# Patient Record
Sex: Female | Born: 1990 | Race: Black or African American | Hispanic: No | Marital: Single | Smoking: Never smoker
Health system: Southern US, Community
[De-identification: ages and names within clinical notes are randomized; demographics above are authoritative.]

## PROBLEM LIST (undated history)

## (undated) ENCOUNTER — Inpatient Hospital Stay (HOSPITAL_COMMUNITY): Payer: Self-pay

---

## 2010-03-12 ENCOUNTER — Emergency Department (HOSPITAL_COMMUNITY): Admission: EM | Admit: 2010-03-12 | Discharge: 2010-03-12 | Payer: Self-pay | Admitting: Family Medicine

## 2010-11-25 ENCOUNTER — Emergency Department (HOSPITAL_COMMUNITY)
Admission: EM | Admit: 2010-11-25 | Discharge: 2010-11-25 | Payer: Self-pay | Source: Home / Self Care | Admitting: Emergency Medicine

## 2011-02-26 LAB — POCT PREGNANCY, URINE: Preg Test, Ur: NEGATIVE

## 2011-03-11 LAB — POCT PREGNANCY, URINE: Preg Test, Ur: NEGATIVE

## 2011-03-11 LAB — POCT URINALYSIS DIP (DEVICE)
Glucose, UA: NEGATIVE mg/dL
Protein, ur: NEGATIVE mg/dL

## 2011-09-28 ENCOUNTER — Inpatient Hospital Stay (INDEPENDENT_AMBULATORY_CARE_PROVIDER_SITE_OTHER)
Admission: RE | Admit: 2011-09-28 | Discharge: 2011-09-28 | Disposition: A | Discharge status: Home / Self Care | Payer: Medicaid Other | Source: Ambulatory Visit | Attending: Family Medicine | Admitting: Family Medicine

## 2011-09-28 DIAGNOSIS — M25519 Pain in unspecified shoulder: Secondary | ICD-10-CM

## 2011-09-28 LAB — POCT I-STAT, CHEM 8
BUN: 4 mg/dL — ABNORMAL LOW (ref 6–23)
Calcium, Ion: 1.25 mmol/L (ref 1.12–1.32)
Creatinine, Ser: 0.8 mg/dL (ref 0.50–1.10)
Glucose, Bld: 84 mg/dL (ref 70–99)
Sodium: 140 mEq/L (ref 135–145)
TCO2: 24 mmol/L (ref 0–100)

## 2015-08-31 ENCOUNTER — Encounter (HOSPITAL_COMMUNITY): Payer: Self-pay | Admitting: Emergency Medicine

## 2015-08-31 ENCOUNTER — Emergency Department (HOSPITAL_COMMUNITY)
Admission: EM | Admit: 2015-08-31 | Discharge: 2015-08-31 | Disposition: A | Discharge status: Home / Self Care | Payer: Medicaid Other | Attending: Emergency Medicine | Admitting: Emergency Medicine

## 2015-08-31 DIAGNOSIS — Y990 Civilian activity done for income or pay: Secondary | ICD-10-CM | POA: Insufficient documentation

## 2015-08-31 DIAGNOSIS — Y9289 Other specified places as the place of occurrence of the external cause: Secondary | ICD-10-CM | POA: Insufficient documentation

## 2015-08-31 DIAGNOSIS — Y9389 Activity, other specified: Secondary | ICD-10-CM | POA: Insufficient documentation

## 2015-08-31 DIAGNOSIS — S161XXA Strain of muscle, fascia and tendon at neck level, initial encounter: Secondary | ICD-10-CM

## 2015-08-31 DIAGNOSIS — Z880 Allergy status to penicillin: Secondary | ICD-10-CM | POA: Insufficient documentation

## 2015-08-31 DIAGNOSIS — H9203 Otalgia, bilateral: Secondary | ICD-10-CM | POA: Insufficient documentation

## 2015-08-31 DIAGNOSIS — X58XXXA Exposure to other specified factors, initial encounter: Secondary | ICD-10-CM | POA: Insufficient documentation

## 2015-08-31 MED ORDER — NAPROXEN 250 MG PO TABS
500.0000 mg | ORAL_TABLET | Freq: Once | ORAL | Status: AC
  Administered 2015-08-31: 500 mg via ORAL
  Filled 2015-08-31: qty 2

## 2015-08-31 MED ORDER — CYCLOBENZAPRINE HCL 10 MG PO TABS: 5.0000 mg | ORAL_TABLET | Freq: Two times a day (BID) | ORAL | Status: DC | PRN

## 2015-08-31 MED ORDER — NAPROXEN 500 MG PO TABS: 500.0000 mg | ORAL_TABLET | Freq: Two times a day (BID) | ORAL | Status: DC

## 2015-08-31 NOTE — Discharge Instructions (Signed)
Cervical Sprain °A cervical sprain is an injury in the neck in which the strong, fibrous tissues (ligaments) that connect your neck bones stretch or tear. Cervical sprains can range from mild to severe. Severe cervical sprains can cause the neck vertebrae to be unstable. This can lead to damage of the spinal cord and can result in serious nervous system problems. The amount of time it takes for a cervical sprain to get better depends on the cause and extent of the injury. Most cervical sprains heal in 1 to 3 weeks. °CAUSES  °Severe cervical sprains may be caused by:  °· Contact sport injuries (such as from football, rugby, wrestling, hockey, auto racing, gymnastics, diving, martial arts, or boxing).   °· Motor vehicle collisions.   °· Whiplash injuries. This is an injury from a sudden forward and backward whipping movement of the head and neck.  °· Falls.   °Mild cervical sprains may be caused by:  °· Being in an awkward position, such as while cradling a telephone between your ear and shoulder.   °· Sitting in a chair that does not offer proper support.   °· Working at a poorly designed computer station.   °· Looking up or down for long periods of time.   °SYMPTOMS  °· Pain, soreness, stiffness, or a burning sensation in the front, back, or sides of the neck. This discomfort may develop immediately after the injury or slowly, 24 hours or more after the injury.   °· Pain or tenderness directly in the middle of the back of the neck.   °· Shoulder or upper back pain.   °· Limited ability to move the neck.   °· Headache.   °· Dizziness.   °· Weakness, numbness, or tingling in the hands or arms.   °· Muscle spasms.   °· Difficulty swallowing or chewing.   °· Tenderness and swelling of the neck.   °DIAGNOSIS  °Most of the time your health care provider can diagnose a cervical sprain by taking your history and doing a physical exam. Your health care provider will ask about previous neck injuries and any known neck  problems, such as arthritis in the neck. X-rays may be taken to find out if there are any other problems, such as with the bones of the neck. Other tests, such as a CT scan or MRI, may also be needed.  °TREATMENT  °Treatment depends on the severity of the cervical sprain. Mild sprains can be treated with rest, keeping the neck in place (immobilization), and pain medicines. Severe cervical sprains are immediately immobilized. Further treatment is done to help with pain, muscle spasms, and other symptoms and may include: °· Medicines, such as pain relievers, numbing medicines, or muscle relaxants.   °· Physical therapy. This may involve stretching exercises, strengthening exercises, and posture training. Exercises and improved posture can help stabilize the neck, strengthen muscles, and help stop symptoms from returning.   °HOME CARE INSTRUCTIONS  °· Put ice on the injured area.   °¨ Put ice in a plastic bag.   °¨ Place a towel between your skin and the bag.   °¨ Leave the ice on for 15-20 minutes, 3-4 times a day.   °· If your injury was severe, you may have been given a cervical collar to wear. A cervical collar is a two-piece collar designed to keep your neck from moving while it heals. °¨ Do not remove the collar unless instructed by your health care provider. °¨ If you have long hair, keep it outside of the collar. °¨ Ask your health care provider before making any adjustments to your collar. Minor   remove the collar unless instructed by your health care provider.   If you have long hair, keep it outside of the collar.   Ask your health care provider before making any adjustments to your collar. Minor adjustments may be required over time to improve comfort and reduce pressure on your chin or on the back of your head.   Ifyou are allowed to remove the collar for cleaning or bathing, follow your health care provider's instructions on how to do so safely.   Keep your collar clean by wiping it with mild soap and water and drying it completely. If the collar you have been given includes removable pads, remove them every 1-2 days and hand wash them with soap and water. Allow them to air dry. They should be completely  dry before you wear them in the collar.   If you are allowed to remove the collar for cleaning and bathing, wash and dry the skin of your neck. Check your skin for irritation or sores. If you see any, tell your health care provider.   Do not drive while wearing the collar.    Only take over-the-counter or prescription medicines for pain, discomfort, or fever as directed by your health care provider.    Keep all follow-up appointments as directed by your health care provider.    Keep all physical therapy appointments as directed by your health care provider.    Make any needed adjustments to your workstation to promote good posture.    Avoid positions and activities that make your symptoms worse.    Warm up and stretch before being active to help prevent problems.   SEEK MEDICAL CARE IF:    Your pain is not controlled with medicine.    You are unable to decrease your pain medicine over time as planned.    Your activity level is not improving as expected.   SEEK IMMEDIATE MEDICAL CARE IF:    You develop any bleeding.   You develop stomach upset.   You have signs of an allergic reaction to your medicine.    Your symptoms get worse.    You develop new, unexplained symptoms.    You have numbness, tingling, weakness, or paralysis in any part of your body.   MAKE SURE YOU:    Understand these instructions.   Will watch your condition.   Will get help right away if you are not doing well or get worse.  Document Released: 09/29/2007 Document Revised: 12/07/2013 Document Reviewed: 06/09/2013  ExitCare Patient Information 2015 [employer]. This information is not intended to replace advice given to you by your health care provider. Make sure you discuss any questions you have with your health care provider.  RICE: Routine Care for Injuries  The routine care of many injuries includes Rest, Ice, Compression, and Elevation (RICE).  HOME CARE INSTRUCTIONS   Rest is needed to allow your body  to heal. Routine activities can usually be resumed when comfortable. Injured tendons and bones can take up to 6 weeks to heal. Tendons are the cord-like structures that attach muscle to bone.   Ice following an injury helps keep the swelling down and reduces pain.   Put ice in a plastic bag.   Place a towel between your skin and the bag.   Leave the ice on for 15-20 minutes, 3-4 times a day, or as directed by your health care provider. Do this while awake, for the first 24 to 48 hours. After that, continue as   directed by your caregiver.   Compression helps keep swelling down. It also gives support and helps with discomfort. If an elastic bandage has been applied, it should be removed and reapplied every 3 to 4 hours. It should not be applied tightly, but firmly enough to keep swelling down. Watch fingers or toes for swelling, bluish discoloration, coldness, numbness, or excessive pain. If any of these problems occur, remove the bandage and reapply loosely. Contact your caregiver if these problems continue.   Elevation helps reduce swelling and decreases pain. With extremities, such as the arms, hands, legs, and feet, the injured area should be placed near or above the level of the heart, if possible.  SEEK IMMEDIATE MEDICAL CARE IF:   You have persistent pain and swelling.   You develop redness, numbness, or unexpected weakness.   Your symptoms are getting worse rather than improving after several days.  These symptoms may indicate that further evaluation or further X-rays are needed. Sometimes, X-rays may not show a small broken bone (fracture) until 1 week or 10 days later. Make a follow-up appointment with your caregiver. Ask when your X-ray results will be ready. Make sure you get your X-ray results.  Document Released: 03/16/2001 Document Revised: 12/07/2013 Document Reviewed: 05/03/2011  ExitCare Patient Information 2015 [employer]. This information is not intended to replace advice given to you  by your health care provider. Make sure you discuss any questions you have with your health care provider.

## 2015-08-31 NOTE — ED Notes (Signed)
Pt reports felt a pop in her neck last night at work. PT reports pain along right side of her neck. Last motrin 0530. Pt moving all extremities, VSS.

## 2015-08-31 NOTE — ED Provider Notes (Signed)
CSN: [id number]     Arrival date & time 08/31/15  0803 History   This chart was scribed for non-physician practitioner, Marlon Pel, PA-C working with Elwin Mocha, MD, by Jarvis Morgan, ED Scribe. This patient was seen in room TR06C/TR06C and the patient's care was started at 9:02 AM.     Chief Complaint  Patient presents with  . Neck Pain    The history is provided by the patient. No language interpreter was used.     Diane Ruiz 24 y.o.female  PCP: No primary care provider on file.  Blood pressure 132/83, pulse 60, temperature 98.1 F (36.7 C), temperature source Oral, resp. rate 12, last menstrual period 08/24/2015, SpO2 100 %.  SIGNIFICANT PMH: no PMHX CHIEF COMPLAINT: neck pain  When: 1 day ago; last night How: Pt states she was at work and felt a pop in her neck when she bent down Chronicity: acute Location: right side of neck Radiation: right ear Quality and severity: constant and moderate Alleviating factors: nothing Worsening factors: movement  Treatments tried: Motrin 4 hours ago with no relief and rest Associated Symptoms: mild swelling to right side of neck, HA, and b/l otalgia Negative ROS: Confusion, diaphoresis, fever, weakness (general or focal), numbness, change of vision,  dysphagia, aphagia, chest pain, shortness of breath,  back pain, abdominal pains, nausea, vomiting, diarrhea, lower extremity swelling, rash.   History reviewed. No pertinent past medical history. History reviewed. No pertinent past surgical history. History reviewed. No pertinent family history. Social History  Substance Use Topics  . Smoking status: Never Smoker   . Smokeless tobacco: None  . Alcohol Use: No   OB History    No data available     Review of Systems  HENT: Positive for ear pain.   Musculoskeletal: Positive for neck pain.  Neurological: Negative for weakness and numbness.  All other systems reviewed and are negative.     Allergies  Aspirin and  Penicillins  Home Medications   Prior to Admission medications   Medication Sig Start Date End Date Taking? Authorizing Provider  cyclobenzaprine (FLEXERIL) 10 MG tablet Take 0.5-1 tablets (5-10 mg total) by mouth 2 (two) times daily as needed for muscle spasms. 08/31/15   Carmyn Hamm Neva Seat, PA-C  naproxen (NAPROSYN) 500 MG tablet Take 1 tablet (500 mg total) by mouth 2 (two) times daily. 08/31/15   Marlon Pel, PA-C   Triage Vitals: BP 132/83 mmHg  Pulse 60  Temp(Src) 98.1 F (36.7 C) (Oral)  Resp 12  SpO2 100%  LMP 08/24/2015  Physical Exam  Constitutional: She is oriented to person, place, and time. She appears well-developed and well-nourished. No distress.  HENT:  Head: Normocephalic and atraumatic.  Eyes: Conjunctivae and EOM are normal.  Neck: Normal range of motion. Neck supple. No tracheal deviation present.  FROM of neck with some discomfort TTP to right side sternocleidomastoid and to right tricep muscle No midline tenderness  Cardiovascular: Normal rate.   Pulmonary/Chest: Effort normal. No respiratory distress.  Musculoskeletal: Normal range of motion.  Neurological: She is alert and oriented to person, place, and time. She has normal strength and normal reflexes. No sensory deficit.  Equal and strong  grip strength  Skin: Skin is warm and dry.  Psychiatric: She has a normal mood and affect. Her behavior is normal.  Nursing note and vitals reviewed.   ED Course  Procedures (including critical care time)  DIAGNOSTIC STUDIES: Oxygen Saturation is 100% on RA, normal by my interpretation.    COORDINATION  OF CARE: 9:24 AM- Pt advised of plan for treatment and pt agrees. Advised pt to return if symptoms progress or she starts to develop weakness to one side of the body. Recommended ice and heat alternating. Will prescribe Flexeril and provide ortho referral.   Labs Review Labs Reviewed - No data to display  Imaging Review No results found. I have personally  reviewed and evaluated these images and lab results as part of my medical decision-making.   EKG Interpretation None      MDM   Final diagnoses:  Neck strain, initial encounter    24 y.o.Diane Ruiz's  with MSK right sided neck pain.   No neurological deficits and normal neuro exam. No loss of bowel or bladder control. No concern for cauda equina at this time base on HPI and physical exam findings. No fever, night sweats, weight loss, h/o cancer, IVDU. The patient can walk with some discomfort.   Patient Plan 1. Medications: NSAIDs and/or muscle relaxer. Cont usual home medications unless otherwise directed. 2. Treatment: rest, drink plenty of fluids, gentle stretching as discussed, alternate ice and heat  3. Follow Up: Please followup with your primary doctor for discussion of your diagnoses and further evaluation after today's visit; if you do not have a primary care doctor use the resource guide provided to find one  Advised to follow-up with the orthopedist if symptoms do not start to resolve in the next 2-3 days. If develop loss of bowel or urinary control return to the ED as soon as possible for further evaluation. To take the medications as prescribed as they can cause harm if not taken appropriately.   Vital signs are stable at discharge. Filed Vitals:   08/31/15 0812  BP: 132/83  Pulse: 60  Temp: 98.1 F (36.7 C)  Resp: 12    Patient/guardian has voiced understanding and agreed to follow-up with the PCP or specialist.  I personally performed the services described in this documentation, which was scribed in my presence. The recorded information has been reviewed and is accurate.       Marlon Pel, PA-C 08/31/15 1610  Elwin Mocha, MD 08/31/15 [phone]

## 2016-05-27 ENCOUNTER — Emergency Department (HOSPITAL_COMMUNITY)
Admission: EM | Admit: 2016-05-27 | Discharge: 2016-05-27 | Disposition: A | Discharge status: Home / Self Care | Payer: Managed Care, Other (non HMO) | Attending: Emergency Medicine | Admitting: Emergency Medicine

## 2016-05-27 ENCOUNTER — Encounter (HOSPITAL_COMMUNITY): Payer: Self-pay | Admitting: *Deleted

## 2016-05-27 DIAGNOSIS — K5900 Constipation, unspecified: Secondary | ICD-10-CM | POA: Insufficient documentation

## 2016-05-27 DIAGNOSIS — Z79899 Other long term (current) drug therapy: Secondary | ICD-10-CM | POA: Insufficient documentation

## 2016-05-27 DIAGNOSIS — R1030 Lower abdominal pain, unspecified: Secondary | ICD-10-CM | POA: Diagnosis present

## 2016-05-27 DIAGNOSIS — R109 Unspecified abdominal pain: Secondary | ICD-10-CM

## 2016-05-27 LAB — URINALYSIS, ROUTINE W REFLEX MICROSCOPIC
BILIRUBIN URINE: NEGATIVE
GLUCOSE, UA: NEGATIVE mg/dL
HGB URINE DIPSTICK: NEGATIVE
KETONES UR: NEGATIVE mg/dL
Leukocytes, UA: NEGATIVE
Nitrite: NEGATIVE
PROTEIN: NEGATIVE mg/dL
Specific Gravity, Urine: 1.009 (ref 1.005–1.030)
pH: 6 (ref 5.0–8.0)

## 2016-05-27 LAB — COMPREHENSIVE METABOLIC PANEL
ALK PHOS: 32 U/L — AB (ref 38–126)
ALT: 12 U/L — AB (ref 14–54)
AST: 23 U/L (ref 15–41)
Albumin: 3.8 g/dL (ref 3.5–5.0)
Anion gap: 7 (ref 5–15)
BUN: 6 mg/dL (ref 6–20)
CALCIUM: 9.2 mg/dL (ref 8.9–10.3)
CHLORIDE: 107 mmol/L (ref 101–111)
CO2: 22 mmol/L (ref 22–32)
CREATININE: 0.68 mg/dL (ref 0.44–1.00)
Glucose, Bld: 88 mg/dL (ref 65–99)
Potassium: 3.5 mmol/L (ref 3.5–5.1)
SODIUM: 136 mmol/L (ref 135–145)
Total Bilirubin: 0.7 mg/dL (ref 0.3–1.2)
Total Protein: 7.4 g/dL (ref 6.5–8.1)

## 2016-05-27 LAB — CBC
HCT: 33 % — ABNORMAL LOW (ref 36.0–46.0)
Hemoglobin: 10.1 g/dL — ABNORMAL LOW (ref 12.0–15.0)
MCH: 23.8 pg — AB (ref 26.0–34.0)
MCHC: 30.6 g/dL (ref 30.0–36.0)
MCV: 77.6 fL — AB (ref 78.0–100.0)
PLATELETS: 286 10*3/uL (ref 150–400)
RBC: 4.25 MIL/uL (ref 3.87–5.11)
RDW: 15.8 % — AB (ref 11.5–15.5)
WBC: 5 10*3/uL (ref 4.0–10.5)

## 2016-05-27 LAB — LIPASE, BLOOD: LIPASE: 25 U/L (ref 11–51)

## 2016-05-27 LAB — POC URINE PREG, ED: PREG TEST UR: NEGATIVE

## 2016-05-27 MED ORDER — HYDROCORTISONE ACETATE 25 MG RE SUPP: 25.0000 mg | Freq: Every day | RECTAL | Status: DC

## 2016-05-27 MED ORDER — ACETAMINOPHEN 325 MG PO TABS
650.0000 mg | ORAL_TABLET | Freq: Once | ORAL | Status: AC
  Administered 2016-05-27: 650 mg via ORAL
  Filled 2016-05-27: qty 2

## 2016-05-27 MED ORDER — ONDANSETRON 4 MG PO TBDP
8.0000 mg | ORAL_TABLET | Freq: Once | ORAL | Status: AC
  Administered 2016-05-27: 8 mg via ORAL
  Filled 2016-05-27: qty 2

## 2016-05-27 MED ORDER — DOCUSATE SODIUM 100 MG PO CAPS: 100.0000 mg | ORAL_CAPSULE | Freq: Two times a day (BID) | ORAL | Status: DC

## 2016-05-27 NOTE — ED Notes (Signed)
Pt stable, ambulatory, states understanding of discharge instructions 

## 2016-05-27 NOTE — ED Notes (Signed)
C/o lower abd pain , states she was having abd. Pain worse with bowel movements., states her stool is very hard today c/o rectal pain

## 2016-05-27 NOTE — ED Provider Notes (Signed)
CSN: [id number]     Arrival date & time 05/27/16  1159 History   First MD Initiated Contact with Patient 05/27/16 1958     Chief Complaint  Patient presents with  . Abdominal Pain     Patient is a 25 y.o. female presenting with abdominal pain. The history is provided by the patient.  Abdominal Pain Pain location:  RLQ and LLQ Pain quality: cramping   Pain severity:  Moderate Onset quality:  Gradual Duration:  1 day Timing:  Constant Progression:  Unchanged Chronicity:  New Relieved by:  Nothing Worsened by:  Bowel movements Associated symptoms: constipation, hematochezia and vomiting   Associated symptoms: no diarrhea, no dysuria, no fever, no vaginal bleeding and no vaginal discharge   Risk factors: not pregnant     PMH - none surg hx - none Social History  Substance Use Topics  . Smoking status: Never Smoker   . Smokeless tobacco: None  . Alcohol Use: No   OB History    No data available     Review of Systems  Constitutional: Negative for fever.  Gastrointestinal: Positive for vomiting, abdominal pain, constipation and hematochezia. Negative for diarrhea.  Genitourinary: Negative for dysuria, vaginal bleeding and vaginal discharge.  All other systems reviewed and are negative.     Allergies  Aspirin and Penicillins  Home Medications   Prior to Admission medications   Medication Sig Start Date End Date Taking? Authorizing Provider  cyclobenzaprine (FLEXERIL) 10 MG tablet Take 0.5-1 tablets (5-10 mg total) by mouth 2 (two) times daily as needed for muscle spasms. 08/31/15   Tiffany Neva SeatGreene, PA-C  naproxen (NAPROSYN) 500 MG tablet Take 1 tablet (500 mg total) by mouth 2 (two) times daily. 08/31/15   Tiffany Neva SeatGreene, PA-C   BP 128/82 mmHg  Pulse 70  Temp(Src) 98.6 F (37 C) (Oral)  Resp 18  SpO2 100%  LMP 05/13/2016 Physical Exam CONSTITUTIONAL: Well developed/well nourished HEAD: Normocephalic/atraumatic EYES: EOMI/PERRL ENMT: Mucous membranes  moist NECK: supple no meningeal signs SPINE/BACK:entire spine nontender CV: S1/S2 noted, no murmurs/rubs/gallops noted LUNGS: Lungs are clear to auscultation bilaterally, no apparent distress ABDOMEN: soft, nontender, no rebound or guarding, bowel sounds noted throughout abdomen GU:no cva tenderness Rectal - no masses.  No abscess.  No blood or melena noted.  Nurse Wilford Sportscassie present for exam NEURO: Pt is awake/alert/appropriate, moves all extremitiesx4.  No facial droop.   EXTREMITIES: pulses normal/equal, full ROM SKIN: warm, color normal PSYCH: no abnormalities of mood noted, alert and oriented to situation  ED Course  Procedures  Pt had constipation last night with hard stool and since then has had abd pain Also reports some blood in stool Will need rectal exam Pt stable    Pt well appearing Exam unremarkable except for tenderness on rectal exam (no abscess/mass noted) Suspect this due to hard stool that has caused rectal tenderness D/c home  Labs Review Labs Reviewed  COMPREHENSIVE METABOLIC PANEL - Abnormal; Notable for the following:    ALT 12 (*)    Alkaline Phosphatase 32 (*)    All other components within normal limits  CBC - Abnormal; Notable for the following:    Hemoglobin 10.1 (*)    HCT 33.0 (*)    MCV 77.6 (*)    MCH 23.8 (*)    RDW 15.8 (*)    All other components within normal limits  LIPASE, BLOOD  URINALYSIS, ROUTINE W REFLEX MICROSCOPIC (NOT AT [facility])  POC URINE PREG, ED    I have  personally reviewed and evaluated these  lab results as part of my medical decision-making.    MDM   Final diagnoses:  Abdominal pain, unspecified abdominal location  Constipation, unspecified constipation type    Nursing notes including past medical history and social history reviewed and considered in documentation Labs/vital reviewed myself and considered during evaluation     Zadie Rhine, MD 05/27/16 2152

## 2016-05-27 NOTE — Discharge Instructions (Signed)

## 2016-05-27 NOTE — ED Notes (Signed)
Gave pt graham crackers and ice water, per [place] Charity fund[place].

## 2016-11-12 ENCOUNTER — Emergency Department (HOSPITAL_BASED_OUTPATIENT_CLINIC_OR_DEPARTMENT_OTHER)
Admission: EM | Admit: 2016-11-12 | Discharge: 2016-11-13 | Disposition: A | Discharge status: Home / Self Care | Payer: Managed Care, Other (non HMO) | Attending: Emergency Medicine | Admitting: Emergency Medicine

## 2016-11-12 ENCOUNTER — Encounter (HOSPITAL_BASED_OUTPATIENT_CLINIC_OR_DEPARTMENT_OTHER): Payer: Self-pay

## 2016-11-12 DIAGNOSIS — O26851 Spotting complicating pregnancy, first trimester: Secondary | ICD-10-CM | POA: Insufficient documentation

## 2016-11-12 DIAGNOSIS — Z3A01 Less than 8 weeks gestation of pregnancy: Secondary | ICD-10-CM | POA: Diagnosis not present

## 2016-11-12 DIAGNOSIS — N939 Abnormal uterine and vaginal bleeding, unspecified: Secondary | ICD-10-CM

## 2016-11-12 DIAGNOSIS — Z3490 Encounter for supervision of normal pregnancy, unspecified, unspecified trimester: Secondary | ICD-10-CM

## 2016-11-12 LAB — URINALYSIS, ROUTINE W REFLEX MICROSCOPIC
BILIRUBIN URINE: NEGATIVE
GLUCOSE, UA: NEGATIVE mg/dL
HGB URINE DIPSTICK: NEGATIVE
Ketones, ur: NEGATIVE mg/dL
Leukocytes, UA: NEGATIVE
Nitrite: NEGATIVE
PH: 6 (ref 5.0–8.0)
Protein, ur: NEGATIVE mg/dL
SPECIFIC GRAVITY, URINE: 1.019 (ref 1.005–1.030)

## 2016-11-12 LAB — PREGNANCY, URINE: Preg Test, Ur: POSITIVE — AB

## 2016-11-12 NOTE — ED Triage Notes (Signed)
C/o abd pain, vaginal spotting x today-pos home preg test-LMP 10/19-NAD-steady gait

## 2016-11-13 ENCOUNTER — Ambulatory Visit (HOSPITAL_BASED_OUTPATIENT_CLINIC_OR_DEPARTMENT_OTHER)
Admission: RE | Admit: 2016-11-13 | Discharge: 2016-11-13 | Disposition: A | Discharge status: Home / Self Care | Payer: Managed Care, Other (non HMO) | Source: Ambulatory Visit | Attending: Emergency Medicine | Admitting: Emergency Medicine

## 2016-11-13 ENCOUNTER — Other Ambulatory Visit (HOSPITAL_BASED_OUTPATIENT_CLINIC_OR_DEPARTMENT_OTHER): Payer: Self-pay | Admitting: Emergency Medicine

## 2016-11-13 DIAGNOSIS — Z3A01 Less than 8 weeks gestation of pregnancy: Secondary | ICD-10-CM

## 2016-11-13 DIAGNOSIS — O209 Hemorrhage in early pregnancy, unspecified: Secondary | ICD-10-CM

## 2016-11-13 LAB — WET PREP, GENITAL
CLUE CELLS WET PREP: NONE SEEN
Sperm: NONE SEEN
Trich, Wet Prep: NONE SEEN
YEAST WET PREP: NONE SEEN

## 2016-11-13 LAB — HCG, QUANTITATIVE, PREGNANCY: HCG, BETA CHAIN, QUANT, S: 60472 m[IU]/mL — AB (ref <5)

## 2016-11-13 LAB — ABO/RH: ABO/RH(D): A POS

## 2016-11-13 NOTE — ED Provider Notes (Signed)
Patient returned for outpatient u/s results.  U/s shows 4 week/2 day IUP, fetal bradycardia noted - discussed results w pt.  Today, pt is having no abd/pelvic pain, no vaginal bleeding. Discussed dx early preg/threatened miscarriage. Rec rest, adequate fluids, avoiding vigorous exertion/activity, and close outpt gyn f/u.  Patient indicates she already has ob/gyn f/u arranged in the next 2 weeks, she will bring current u/s result to that appt.    Cathren LaineKevin Erron Wengert, MD 11/13/16 [phone]

## 2016-11-13 NOTE — Discharge Instructions (Signed)
Return tomorrow at the given time for an ultrasound to evaluate your pregnancy.  [facility] or bleeding worsen in the meantime.  Vaginal rest until cleared by your obstetrician.

## 2016-11-13 NOTE — ED Provider Notes (Signed)
[facility]   CSN: [id number] Arrival date & time: 11/12/16  2259     History   Chief Complaint Chief Complaint  Patient presents with  . Abdominal Pain    HPI Diane Ruiz is a 25 y.o. female.  Patient is a 25 year old female G1 at approximately one month gestation. She presents for evaluation of lower abdominal discomfort and spotting that occurred earlier today. She denies any significant bleeding. She denies any fevers, chills, or discharge. She just had a positive pregnancy test at home this past week and has not been seen by OB. She denies any bowel or bladder complaints.   The history is provided by the patient.  Abdominal Pain   This is a new problem. Episode onset: This afternoon. Episode frequency: Intermittent. The problem has not changed since onset.The pain is located in the suprapubic region. The pain is moderate. Pertinent negatives include fever. Nothing aggravates the symptoms. Nothing relieves the symptoms.    History reviewed. No pertinent past medical history.  There are no active problems to display for this patient.   History reviewed. No pertinent surgical history.  OB History    No data available       Home Medications    Prior to Admission medications   Not on File    Family History No family history on file.  Social History Social History  Substance Use Topics  . Smoking status: Never Smoker  . Smokeless tobacco: Never Used  . Alcohol use No     Allergies   Aspirin; Peanut-containing drug products; and Penicillins   Review of Systems Review of Systems  Constitutional: Negative for fever.  Gastrointestinal: Positive for abdominal pain.  All other systems reviewed and are negative.    Physical Exam Updated Vital Signs BP 148/94 (BP Location: Left Arm)   Pulse 60   Temp 97.7 F (36.5 C) (Oral)   Resp 18   Ht 5\' 1"  (1.549 m)   Wt 170 lb (77.1 kg)   LMP 10/03/2016   SpO2 98%   BMI 32.12  kg/m   Physical Exam  Constitutional: She is oriented to person, place, and time. She appears well-developed and well-nourished. No distress.  HENT:  Head: Normocephalic and atraumatic.  Neck: Normal range of motion. Neck supple.  Cardiovascular: Normal rate and regular rhythm.  Exam reveals no gallop and no friction rub.   No murmur heard. Pulmonary/Chest: Effort normal and breath sounds normal. No respiratory distress. She has no wheezes.  Abdominal: Soft. Bowel sounds are normal. She exhibits no distension. There is no tenderness.  Genitourinary: Vagina normal. No vaginal discharge found.  Genitourinary Comments: There is slight blood at the opening of a closed os. There is no cervical motion tenderness. There are no adnexal tenderness or masses.  Musculoskeletal: Normal range of motion.  Neurological: She is alert and oriented to person, place, and time.  Skin: Skin is warm and dry. She is not diaphoretic.  Nursing note and vitals reviewed.    ED Treatments / Results  Labs (all labs ordered are listed, but only abnormal results are displayed) Labs Reviewed  PREGNANCY, URINE - Abnormal; Notable for the following:       Result Value   Preg Test, Ur POSITIVE (*)    All other components within normal limits  WET PREP, GENITAL  URINALYSIS, ROUTINE W REFLEX MICROSCOPIC (NOT AT [facility])  HCG, QUANTITATIVE, PREGNANCY  HIV ANTIBODY (ROUTINE TESTING)  ABO/RH  GC/CHLAMYDIA PROBE AMP ([place]) NOT  AT [facility]    EKG  EKG Interpretation None       Radiology No results found.  Procedures Procedures (including critical care time)  Medications Ordered in ED Medications - No data to display   Initial Impression / Assessment and Plan / ED Course  I have reviewed the triage vital signs and the nursing notes.  Pertinent labs & imaging results that were available during my care of the patient were reviewed by me and considered in my medical decision making (see chart for  details).  Clinical Course     Patient presents with vaginal spotting and lower abdominal discomfort. She is roughly 4-[redacted] weeks pregnant by her last menstrual period. Workup today reveals a beta 60,000, a closed os, and benign abdomen. Ultrasound has left for the evening, so the patient will be returned in the morning to rule out ectopic pregnancy. She appears hemodynamically stable and in no significant discomfort. She understands that if her condition worsens in the meantime, she is to [facility] for further [facility].  Final Clinical Impressions(s) / ED Diagnoses   Final diagnoses:  None    New Prescriptions New Prescriptions   No medications on file     Geoffery Lyonsouglas Shamyra Farias, MD 11/13/16 [phone]

## 2016-11-14 LAB — GC/CHLAMYDIA PROBE AMP (~~LOC~~) NOT AT ARMC
CHLAMYDIA, DNA PROBE: NEGATIVE
NEISSERIA GONORRHEA: NEGATIVE

## 2016-12-11 ENCOUNTER — Encounter (HOSPITAL_COMMUNITY): Payer: Self-pay | Admitting: *Deleted

## 2016-12-11 ENCOUNTER — Inpatient Hospital Stay (HOSPITAL_COMMUNITY)
Admission: AD | Admit: 2016-12-11 | Discharge: 2016-12-11 | Disposition: A | Discharge status: Home / Self Care | Payer: Managed Care, Other (non HMO) | Source: Ambulatory Visit | Attending: Obstetrics and Gynecology | Admitting: Obstetrics and Gynecology

## 2016-12-11 DIAGNOSIS — Z3491 Encounter for supervision of normal pregnancy, unspecified, first trimester: Secondary | ICD-10-CM

## 2016-12-11 DIAGNOSIS — O26891 Other specified pregnancy related conditions, first trimester: Secondary | ICD-10-CM | POA: Diagnosis present

## 2016-12-11 DIAGNOSIS — Z3A09 9 weeks gestation of pregnancy: Secondary | ICD-10-CM | POA: Insufficient documentation

## 2016-12-11 DIAGNOSIS — R51 Headache: Secondary | ICD-10-CM | POA: Insufficient documentation

## 2016-12-11 DIAGNOSIS — R109 Unspecified abdominal pain: Secondary | ICD-10-CM | POA: Insufficient documentation

## 2016-12-11 LAB — URINALYSIS, ROUTINE W REFLEX MICROSCOPIC
BILIRUBIN URINE: NEGATIVE
Glucose, UA: NEGATIVE mg/dL
KETONES UR: 20 mg/dL — AB
NITRITE: NEGATIVE
Protein, ur: NEGATIVE mg/dL
SPECIFIC GRAVITY, URINE: 1.018 (ref 1.005–1.030)
pH: 6 (ref 5.0–8.0)

## 2016-12-11 NOTE — MAU Note (Signed)
Pt called out with questions regarding [facility]. Explained to pt why she was being transferred and pt states she does not want to go in an ambulance and pt [facility]. Dr. Jolayne Pantheronstant made aware and [facility] cancelled.
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
## 2017-07-04 IMAGING — US US OB COMP LESS 14 WK
1 series · 14 of 27 positions shown · non-contrast
Comparison: None.

CLINICAL DATA: Right adnexal pain for 1 day.

EXAM:
OBSTETRIC <14 WK US AND TRANSVAGINAL OB US
TECHNIQUE: Both transabdominal and transvaginal ultrasound examinations were
performed for complete evaluation of the gestation as well as the
maternal uterus, adnexal regions, and pelvic cul-de-sac.
Transvaginal technique was performed to assess early pregnancy.

[Series 1: us ob comp less 14 wk · 0.15mm/px · 27 acquisitions, 14 frames shown]
[im 1/27]
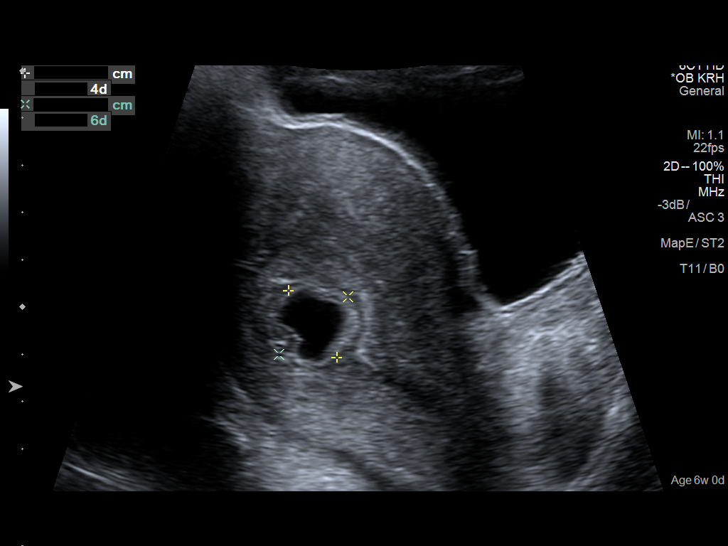
[im 3/27]
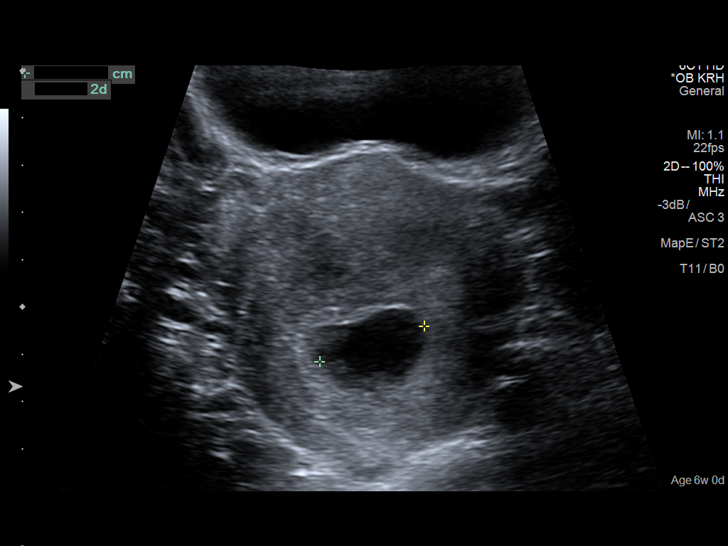
[im 5/27]
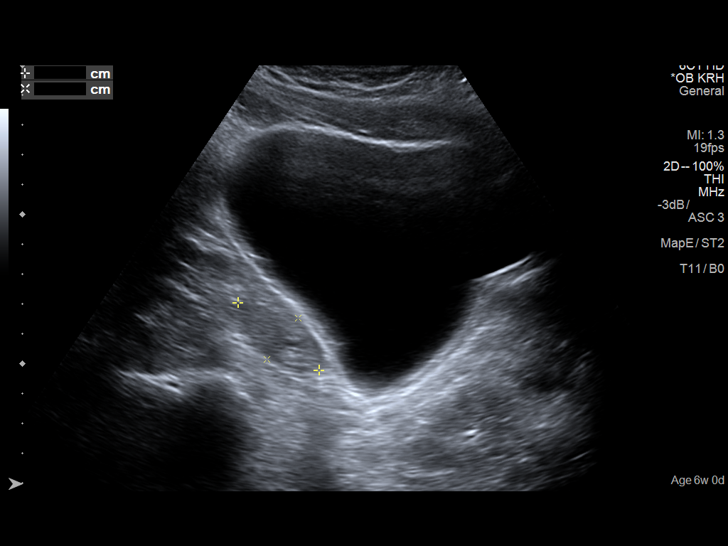
[im 7/27]
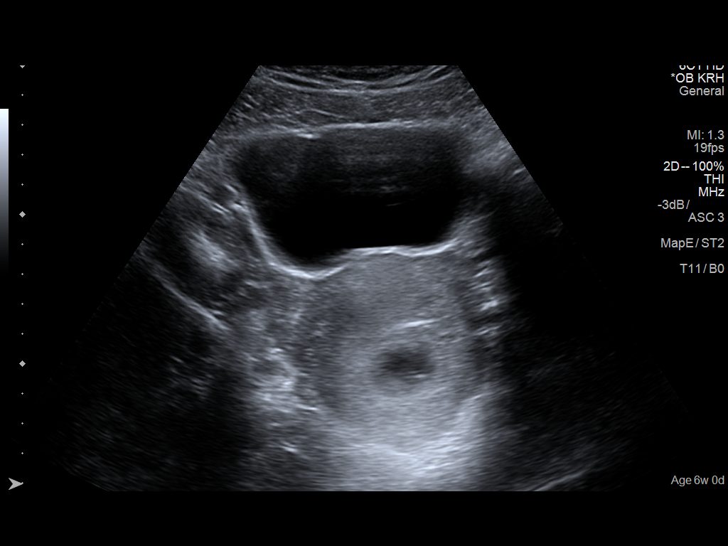
[im 9/27]
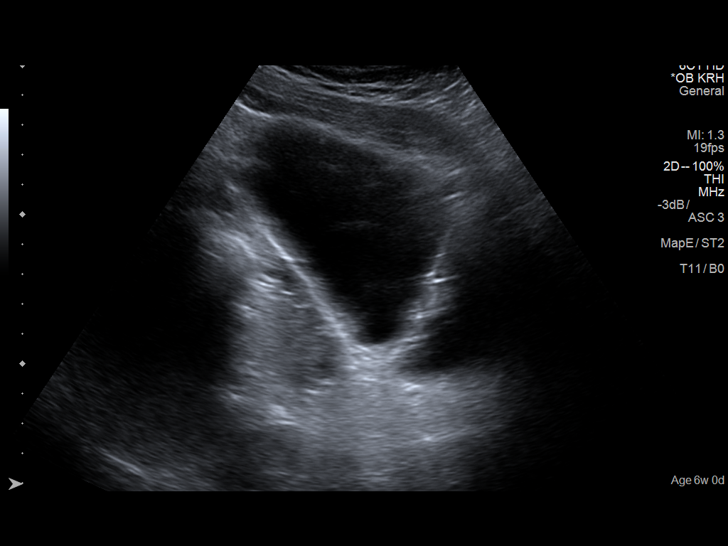
[im 11/27]
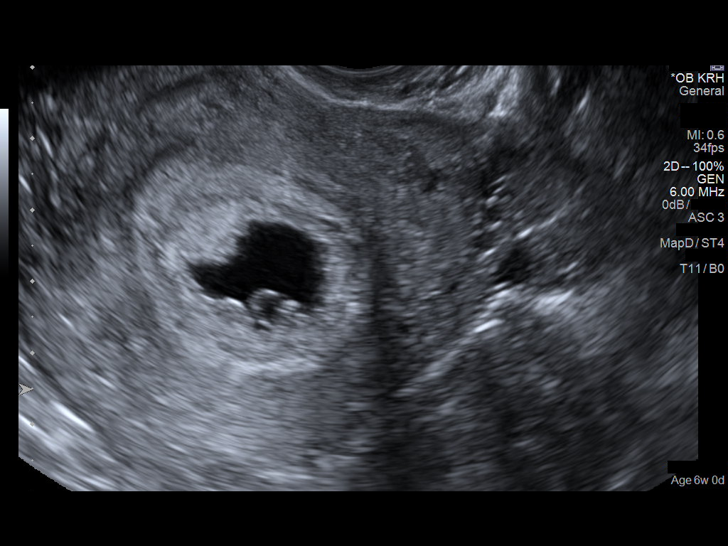
[im 13/27]
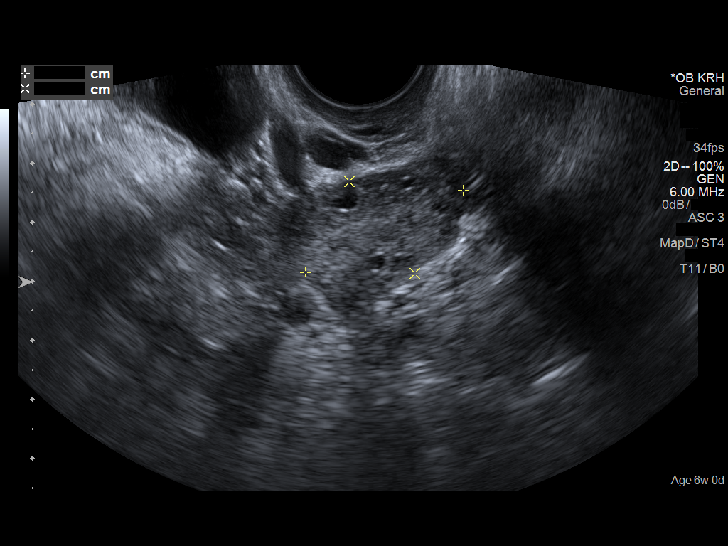
[im 15/27]
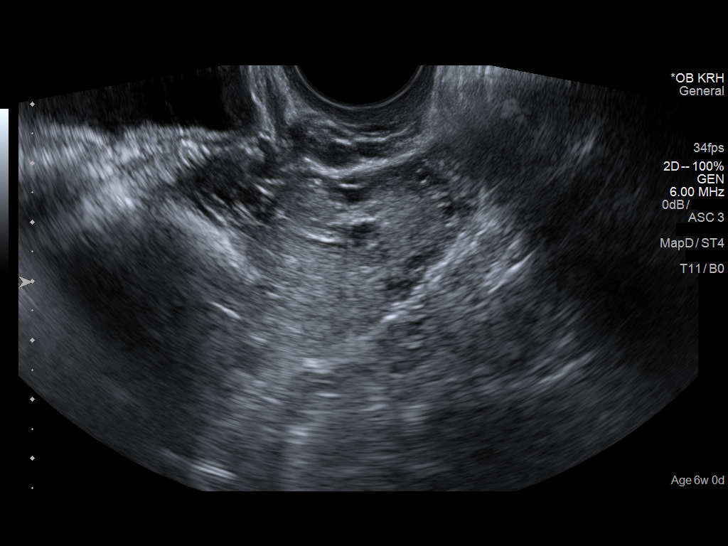
[im 17/27]
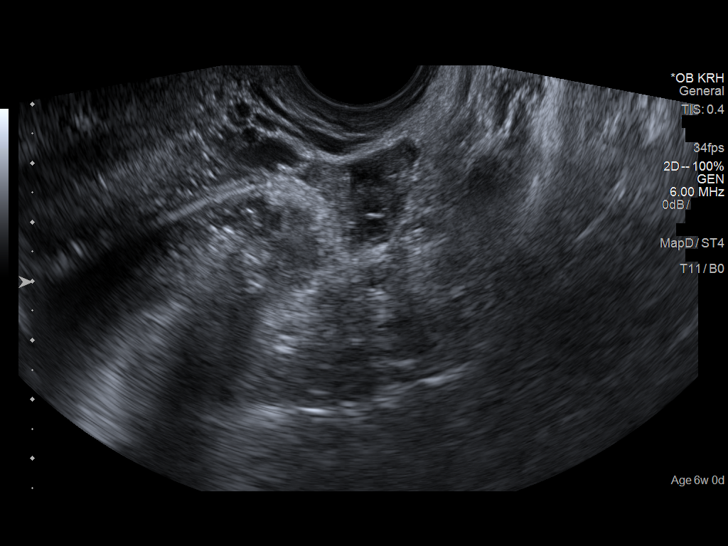
[im 19/27]
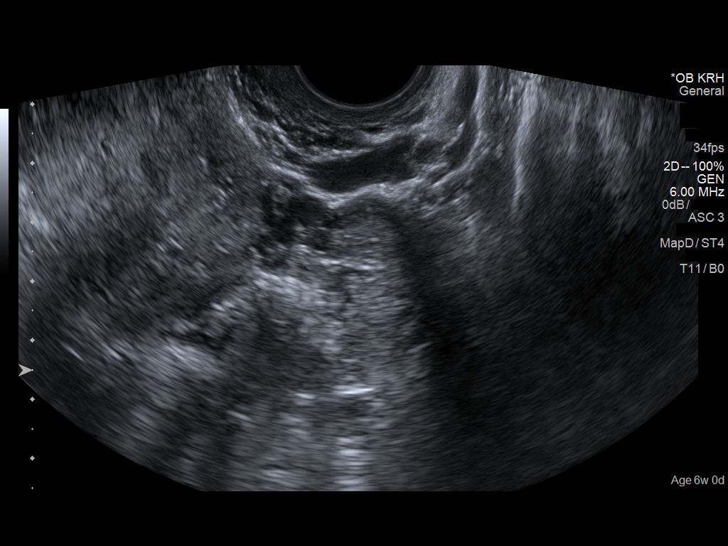
[im 21/27]
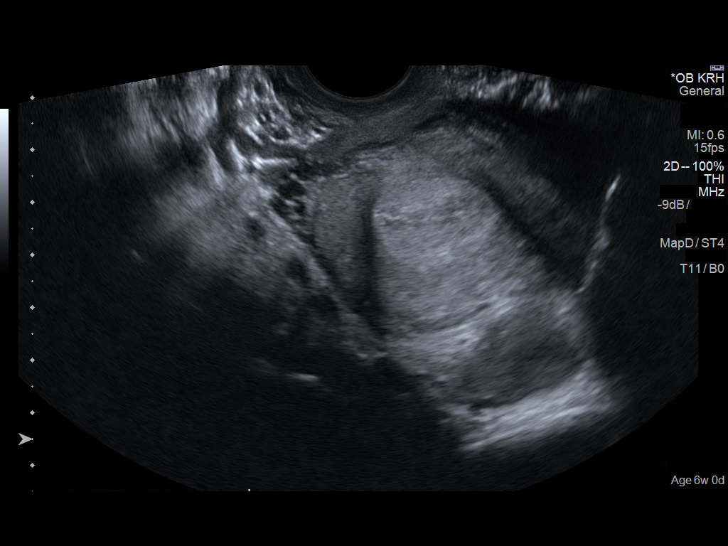
[im 23/27]
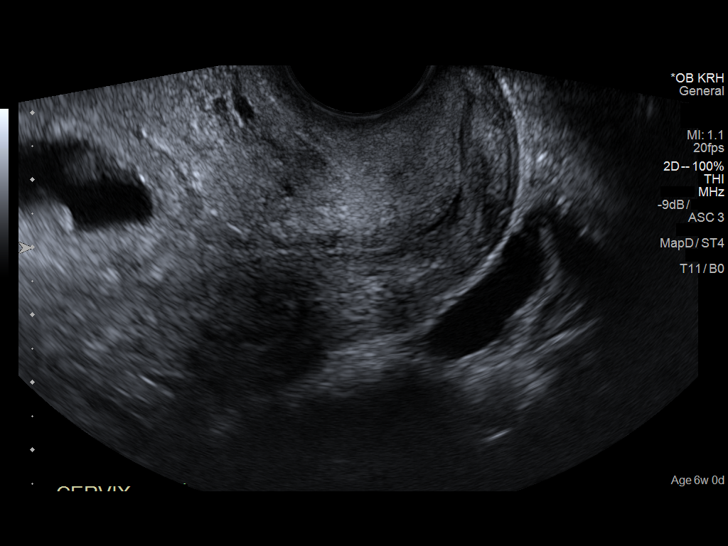
[im 25/27]
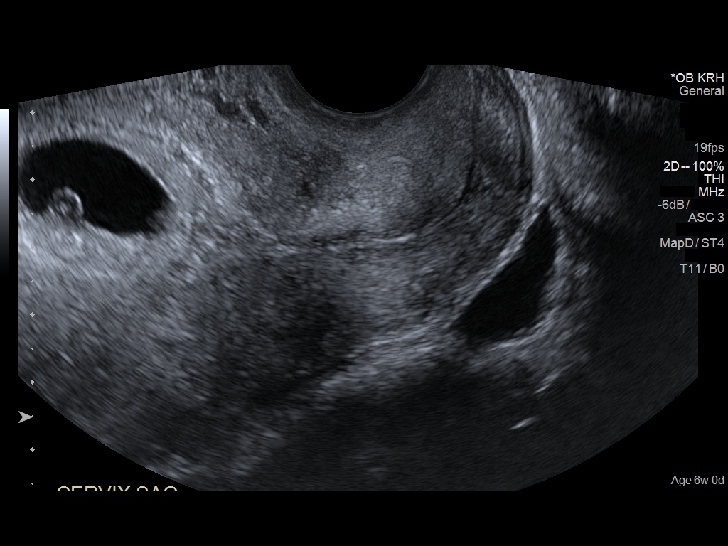
[im 27/27]
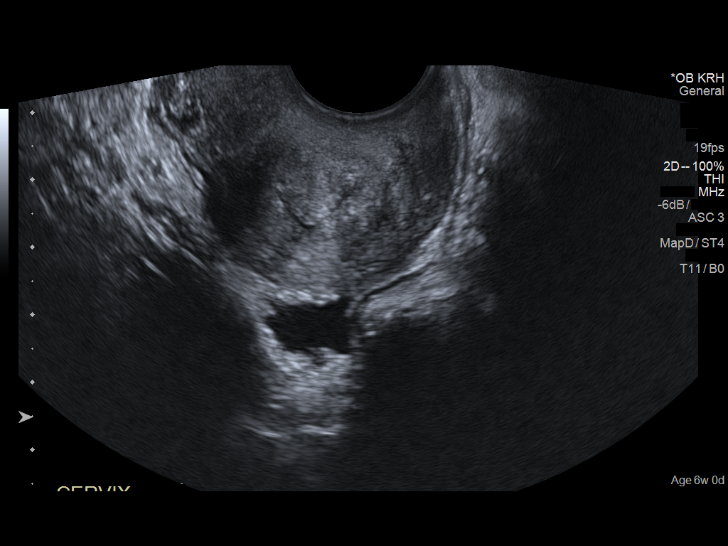

[14 of 27 positions shown; findings below may reference images not displayed]

FINDINGS: Intrauterine gestational sac: Single

Yolk sac:  Visualized

Embryo:  Visualized

Cardiac Activity: Visualized

Heart Rate: 99  bpm

MSD:   mm    w     d

CRL:  4.2  mm   6 w   1 d                  US EDC: 07/08/2017

Subchorionic hemorrhage:  None visualized.

Maternal uterus/adnexae: No adnexal masses. Small amount of free
fluid in the cul-de-sac.
IMPRESSION: Four week 2 day intrauterine pregnancy. Fetal bradycardia with fetal
heart rate 99 beats per minute. This may be related to early
gestational age pinky be followed with repeat ultrasound in 2 weeks.

No acute maternal findings.

## 2017-10-16 ENCOUNTER — Encounter (HOSPITAL_COMMUNITY): Payer: Self-pay

## 2019-11-25 ENCOUNTER — Emergency Department
Admission: EM | Admit: 2019-11-25 | Discharge: 2019-11-25 | Discharge status: Absent without leave | Payer: Managed Care, Other (non HMO)
